# Patient Record
Sex: Female | Born: 1995 | Race: Black or African American | Hispanic: No | Marital: Single | Smoking: Never smoker
Health system: Southern US, Community
[De-identification: ages and names within clinical notes are randomized; demographics above are authoritative.]

---

## 2016-11-24 ENCOUNTER — Encounter (HOSPITAL_COMMUNITY): Payer: Self-pay | Admitting: Emergency Medicine

## 2016-11-24 ENCOUNTER — Inpatient Hospital Stay (HOSPITAL_COMMUNITY): Payer: Federal, State, Local not specified - PPO

## 2016-11-24 ENCOUNTER — Inpatient Hospital Stay (HOSPITAL_COMMUNITY)
Admission: EM | Admit: 2016-11-24 | Discharge: 2016-11-26 | DRG: 093 | Disposition: A | Discharge status: Home / Self Care | Payer: Federal, State, Local not specified - PPO | Attending: Internal Medicine | Admitting: Internal Medicine

## 2016-11-24 DIAGNOSIS — E876 Hypokalemia: Secondary | ICD-10-CM | POA: Diagnosis present

## 2016-11-24 DIAGNOSIS — R45850 Homicidal ideations: Secondary | ICD-10-CM | POA: Diagnosis present

## 2016-11-24 DIAGNOSIS — G92 Toxic encephalopathy: Principal | ICD-10-CM | POA: Diagnosis present

## 2016-11-24 DIAGNOSIS — R443 Hallucinations, unspecified: Secondary | ICD-10-CM

## 2016-11-24 DIAGNOSIS — D649 Anemia, unspecified: Secondary | ICD-10-CM | POA: Diagnosis present

## 2016-11-24 DIAGNOSIS — F23 Brief psychotic disorder: Secondary | ICD-10-CM | POA: Diagnosis not present

## 2016-11-24 DIAGNOSIS — Z79899 Other long term (current) drug therapy: Secondary | ICD-10-CM

## 2016-11-24 DIAGNOSIS — R4182 Altered mental status, unspecified: Secondary | ICD-10-CM

## 2016-11-24 DIAGNOSIS — G9340 Encephalopathy, unspecified: Secondary | ICD-10-CM

## 2016-11-24 DIAGNOSIS — F29 Unspecified psychosis not due to a substance or known physiological condition: Secondary | ICD-10-CM

## 2016-11-24 DIAGNOSIS — R000 Tachycardia, unspecified: Secondary | ICD-10-CM

## 2016-11-24 DIAGNOSIS — D509 Iron deficiency anemia, unspecified: Secondary | ICD-10-CM

## 2016-11-24 DIAGNOSIS — E860 Dehydration: Secondary | ICD-10-CM | POA: Diagnosis present

## 2016-11-24 LAB — CBC
HCT: 23.9 % — ABNORMAL LOW (ref 36.0–46.0)
HCT: 25.7 % — ABNORMAL LOW (ref 36.0–46.0)
HEMOGLOBIN: 7 g/dL — AB (ref 12.0–15.0)
Hemoglobin: 6.7 g/dL — CL (ref 12.0–15.0)
MCH: 16.2 pg — AB (ref 26.0–34.0)
MCH: 15.9 pg — ABNORMAL LOW (ref 26.0–34.0)
MCHC: 28 g/dL — AB (ref 30.0–36.0)
MCHC: 27.2 g/dL — AB (ref 30.0–36.0)
MCV: 57.9 fL — AB (ref 78.0–100.0)
MCV: 58.5 fL — ABNORMAL LOW (ref 78.0–100.0)
PLATELETS: 486 10*3/uL — AB (ref 150–400)
Platelets: 426 10*3/uL — ABNORMAL HIGH (ref 150–400)
RBC: 4.13 MIL/uL (ref 3.87–5.11)
RBC: 4.39 MIL/uL (ref 3.87–5.11)
RDW: 20 % — AB (ref 11.5–15.5)
RDW: 19.9 % — ABNORMAL HIGH (ref 11.5–15.5)
WBC: 12.2 10*3/uL — AB (ref 4.0–10.5)
WBC: 10.8 10*3/uL — ABNORMAL HIGH (ref 4.0–10.5)

## 2016-11-24 LAB — ABO/RH: ABO/RH(D): AB POS

## 2016-11-24 LAB — ETHANOL

## 2016-11-24 LAB — COMPREHENSIVE METABOLIC PANEL
ALT: 11 U/L — AB (ref 14–54)
AST: 28 U/L (ref 15–41)
Albumin: 4.2 g/dL (ref 3.5–5.0)
Alkaline Phosphatase: 38 U/L (ref 38–126)
Anion gap: 14 (ref 5–15)
BILIRUBIN TOTAL: 0.9 mg/dL (ref 0.3–1.2)
BUN: 12 mg/dL (ref 6–20)
CHLORIDE: 106 mmol/L (ref 101–111)
CO2: 17 mmol/L — ABNORMAL LOW (ref 22–32)
CREATININE: 0.99 mg/dL (ref 0.44–1.00)
Calcium: 8.6 mg/dL — ABNORMAL LOW (ref 8.9–10.3)
GFR calc Af Amer: >60 mL/min (ref >60)
Glucose, Bld: 104 mg/dL — ABNORMAL HIGH (ref 65–99)
Potassium: 3.6 mmol/L (ref 3.5–5.1)
Sodium: 137 mmol/L (ref 135–145)
Total Protein: 7.8 g/dL (ref 6.5–8.1)

## 2016-11-24 LAB — IRON AND TIBC
Iron: 12 ug/dL — ABNORMAL LOW (ref 28–170)
Saturation Ratios: 2 % — ABNORMAL LOW (ref 10.4–31.8)
TIBC: 563 ug/dL — ABNORMAL HIGH (ref 250–450)
UIBC: 551 ug/dL

## 2016-11-24 LAB — FERRITIN: Ferritin: 2 ng/mL — ABNORMAL LOW (ref 11–307)

## 2016-11-24 LAB — RETICULOCYTES
RBC.: 4.18 MIL/uL (ref 3.87–5.11)
RETIC COUNT ABSOLUTE: 71.1 10*3/uL (ref 19.0–186.0)
Retic Ct Pct: 1.7 % (ref 0.4–3.1)

## 2016-11-24 LAB — TRANSFERRIN: TRANSFERRIN: 395 mg/dL — AB (ref 192–382)

## 2016-11-24 LAB — CREATININE, SERUM
CREATININE: 0.88 mg/dL (ref 0.44–1.00)
GFR calc Af Amer: >60 mL/min (ref >60)

## 2016-11-24 LAB — POC URINE PREG, ED: PREG TEST UR: NEGATIVE

## 2016-11-24 LAB — FOLATE: FOLATE: 18.6 ng/mL (ref >5.9)

## 2016-11-24 LAB — PROTIME-INR
INR: 1.36
Prothrombin Time: 16.9 seconds — ABNORMAL HIGH (ref 11.4–15.2)

## 2016-11-24 LAB — SALICYLATE LEVEL: Salicylate Lvl: <7 mg/dL (ref 2.8–30.0)

## 2016-11-24 LAB — RAPID URINE DRUG SCREEN, HOSP PERFORMED
AMPHETAMINES: NOT DETECTED
BARBITURATES: NOT DETECTED
Benzodiazepines: NOT DETECTED
COCAINE: NOT DETECTED
Opiates: NOT DETECTED
TETRAHYDROCANNABINOL: NOT DETECTED

## 2016-11-24 LAB — PREPARE RBC (CROSSMATCH)

## 2016-11-24 LAB — I-STAT BETA HCG BLOOD, ED (MC, WL, AP ONLY): I-stat hCG, quantitative: <5 m[IU]/mL (ref <5)

## 2016-11-24 LAB — VITAMIN B12: VITAMIN B 12: 261 pg/mL (ref 180–914)

## 2016-11-24 LAB — ACETAMINOPHEN LEVEL: Acetaminophen (Tylenol), Serum: <10 ug/mL — ABNORMAL LOW (ref 10–30)

## 2016-11-24 MED ORDER — SODIUM CHLORIDE 0.9 % IV BOLUS (SEPSIS)
1000.0000 mL | Freq: Once | INTRAVENOUS | Status: AC
  Administered 2016-11-24: 1000 mL via INTRAVENOUS

## 2016-11-24 MED ORDER — ONDANSETRON HCL 4 MG/2ML IJ SOLN: 4.0000 mg | Freq: Four times a day (QID) | INTRAMUSCULAR | Status: DC | PRN

## 2016-11-24 MED ORDER — LORAZEPAM 2 MG/ML IJ SOLN
INTRAMUSCULAR | Status: AC
  Filled 2016-11-24: qty 1

## 2016-11-24 MED ORDER — DEXTROSE-NACL 5-0.9 % IV SOLN
INTRAVENOUS | Status: DC
  Administered 2016-11-24 – 2016-11-25 (×2): via INTRAVENOUS

## 2016-11-24 MED ORDER — HALOPERIDOL 1 MG PO TABS
1.0000 mg | ORAL_TABLET | ORAL | Status: DC | PRN
  Administered 2016-11-24: 1 mg via ORAL
  Filled 2016-11-24 (×2): qty 1

## 2016-11-24 MED ORDER — HALOPERIDOL 5 MG PO TABS
5.0000 mg | ORAL_TABLET | ORAL | Status: DC | PRN
  Administered 2016-11-25: 5 mg via ORAL
  Filled 2016-11-24 (×3): qty 1

## 2016-11-24 MED ORDER — LORAZEPAM 2 MG/ML IJ SOLN: 1.0000 mg | INTRAMUSCULAR | Status: DC | PRN

## 2016-11-24 MED ORDER — ONDANSETRON HCL 4 MG PO TABS: 4.0000 mg | ORAL_TABLET | Freq: Four times a day (QID) | ORAL | Status: DC | PRN

## 2016-11-24 MED ORDER — HALOPERIDOL LACTATE 5 MG/ML IJ SOLN: 1.0000 mg | INTRAMUSCULAR | Status: DC | PRN

## 2016-11-24 MED ORDER — ACETAMINOPHEN 325 MG PO TABS: 650.0000 mg | ORAL_TABLET | Freq: Four times a day (QID) | ORAL | Status: DC | PRN

## 2016-11-24 MED ORDER — LORAZEPAM 2 MG/ML IJ SOLN
2.0000 mg | Freq: Once | INTRAMUSCULAR | Status: AC
  Administered 2016-11-24: 2 mg via INTRAMUSCULAR

## 2016-11-24 MED ORDER — SODIUM CHLORIDE 0.9 % IV SOLN: Freq: Once | INTRAVENOUS | Status: DC

## 2016-11-24 MED ORDER — ENOXAPARIN SODIUM 40 MG/0.4ML ~~LOC~~ SOLN: 40.0000 mg | SUBCUTANEOUS | Status: DC

## 2016-11-24 MED ORDER — ACETAMINOPHEN 650 MG RE SUPP: 650.0000 mg | Freq: Four times a day (QID) | RECTAL | Status: DC | PRN

## 2016-11-24 MED ORDER — HALOPERIDOL LACTATE 5 MG/ML IJ SOLN
5.0000 mg | Freq: Four times a day (QID) | INTRAMUSCULAR | Status: DC | PRN
  Administered 2016-11-24: 5 mg via INTRAMUSCULAR
  Filled 2016-11-24: qty 1

## 2016-11-24 NOTE — Consult Note (Signed)
[facility] Consult   Reason for Consult:  psychosis Referring Physician:  Dr. Cathlean Sauer Patient Identification: Catherine Townsend MRN:  [id number] Principal Diagnosis: Acute psychosis Diagnosis:   Patient Active Problem List   Diagnosis Date Noted  . Acute psychosis [F23] 11/24/2016    Priority: High  . Anemia [D64.9] 11/24/2016    Total Time spent with patient: 45 minutes  Subjective:   Catherine Townsend is a 21 y.o. female patient admitted with bizarre behavior.  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 Patient denies any prior history of mental illness but admits to history of Cannabis abuse. Patient presents with bizarre behavior and she is not aware of her current environment, thinks she is in [place] and has to be told she is in [facility]. Patient seems confused, disoriented and unaware of how she ended up in hospital. She was reportedly brought by the police due to strange behavior, was found naked on the straight, disoriented and confused. Patient reports auditory hallucination-hearing voices telling the future does not exist. When asked questions about her, she keeps saying: "666, IDK, YES, SEX, SIN''. Patient endorses being stressed out with her college work but  denies visual hallucinations, suicidal thoughts, alcohol abuse but smokes Cannabis regularly.  Past Psychiatric History: denies  Risk to Self:   Risk to Others:   Prior Inpatient Therapy:   Prior Outpatient Therapy:    Past Medical History: History reviewed. No pertinent past medical history. History reviewed. No pertinent surgical history. Family History: History reviewed. No pertinent family history. Family Psychiatric  History: Social History:  History  Alcohol Use No     History  Drug Use  . Types: Marijuana    Social History   Social History  . Marital status: Single    Spouse name: N/A  . Number of  children: N/A  . Years of education: N/A   Social History Main Topics  . Smoking status: Unknown If Ever Smoked  . Smokeless tobacco: None  . Alcohol use No  . Drug use: Yes    Types: Marijuana  . Sexual activity: Not Asked   Other Topics Concern  . None   Social History Narrative  . None   Additional Social History:    Allergies:  No Known Allergies  Labs:  Results for orders placed or performed during the hospital encounter of 11/24/16 (from the past 48 hour(s))  Comprehensive metabolic panel     Status: Abnormal   Collection Time: 11/24/16  7:15 AM  Result Value Ref Range   Sodium 137 135 - 145 mmol/L   Potassium 3.6 3.5 - 5.1 mmol/L   Chloride 106 101 - 111 mmol/L   CO2 17 (L) 22 - 32 mmol/L   Glucose, Bld 104 (H) 65 - 99 mg/dL   BUN 12 6 - 20 mg/dL   Creatinine, Ser 0.99 0.44 - 1.00 mg/dL   Calcium 8.6 (L) 8.9 - 10.3 mg/dL   Total Protein 7.8 6.5 - 8.1 g/dL   Albumin 4.2 3.5 - 5.0 g/dL   AST 28 15 - 41 U/L   ALT 11 (L) 14 - 54 U/L   Alkaline Phosphatase 38 38 - 126 U/L   Total Bilirubin 0.9 0.3 - 1.2 mg/dL   GFR calc non Af Amer >60 >60 mL/min   GFR calc Af Amer >60 >60 mL/min    Comment: (NOTE) The eGFR has been calculated  using the CKD EPI equation. This calculation has not been validated in all clinical situations. eGFR's persistently <60 mL/min signify possible Chronic Kidney Disease.    Anion gap 14 5 - 15  Ethanol     Status: None   Collection Time: 11/24/16  7:15 AM  Result Value Ref Range   Alcohol, Ethyl (B) <5 <5 mg/dL    Comment:        LOWEST DETECTABLE LIMIT FOR SERUM ALCOHOL IS 5 mg/dL FOR MEDICAL PURPOSES ONLY   Salicylate level     Status: None   Collection Time: 11/24/16  7:15 AM  Result Value Ref Range   Salicylate Lvl <8.1 2.8 - 30.0 mg/dL  Acetaminophen level     Status: Abnormal   Collection Time: 11/24/16  7:15 AM  Result Value Ref Range   Acetaminophen (Tylenol), Serum <10 (L) 10 - 30 ug/mL    Comment:        THERAPEUTIC  CONCENTRATIONS VARY SIGNIFICANTLY. A RANGE OF 10-30 ug/mL MAY BE AN EFFECTIVE CONCENTRATION FOR MANY PATIENTS. HOWEVER, SOME ARE BEST TREATED AT CONCENTRATIONS OUTSIDE THIS RANGE. ACETAMINOPHEN CONCENTRATIONS >150 ug/mL AT 4 HOURS AFTER INGESTION AND >50 ug/mL AT 12 HOURS AFTER INGESTION ARE OFTEN ASSOCIATED WITH TOXIC REACTIONS.   Protime-INR     Status: Abnormal   Collection Time: 11/24/16  7:15 AM  Result Value Ref Range   Prothrombin Time 16.9 (H) 11.4 - 15.2 seconds   INR 1.36   I-Stat beta hCG blood, ED     Status: None   Collection Time: 11/24/16  7:21 AM  Result Value Ref Range   I-stat hCG, quantitative <5.0 <5 mIU/mL   Comment 3            Comment:   GEST. AGE      CONC.  (mIU/mL)   <=1 WEEK        5 - 50     2 WEEKS       50 - 500     3 WEEKS       100 - 10,000     4 WEEKS     1,000 - 30,000        FEMALE AND NON-PREGNANT FEMALE:     LESS THAN 5 mIU/mL   CBC     Status: Abnormal   Collection Time: 11/24/16  8:00 AM  Result Value Ref Range   WBC 12.2 (H) 4.0 - 10.5 K/uL   RBC 4.13 3.87 - 5.11 MIL/uL   Hemoglobin 6.7 (LL) 12.0 - 15.0 g/dL    Comment: CRITICAL RESULT CALLED TO, READ BACK BY AND VERIFIED WITH: DOWD,P RN [phone] COVINGTON,N    HCT 23.9 (L) 36.0 - 46.0 %   MCV 57.9 (L) 78.0 - 100.0 fL   MCH 16.2 (L) 26.0 - 34.0 pg   MCHC 28.0 (L) 30.0 - 36.0 g/dL   RDW 20.0 (H) 11.5 - 15.5 %   Platelets 486 (H) 150 - 400 K/uL  Type and screen [place]     Status: None (Preliminary result)   Collection Time: 11/24/16  9:00 AM  Result Value Ref Range   ABO/RH(D) AB POS    Antibody Screen NEG    Sample Expiration 11/27/2016    Unit Number [id number]    Blood Component Type RED CELLS,LR    Unit division 00    Status of Unit ALLOCATED    Transfusion Status OK TO TRANSFUSE    Crossmatch Result Compatible   Prepare RBC  Status: None   Collection Time: 11/24/16  9:00 AM  Result Value Ref Range   Order Confirmation ORDER  PROCESSED BY BLOOD BANK   ABO/Rh     Status: None   Collection Time: 11/24/16  9:00 AM  Result Value Ref Range   ABO/RH(D) AB POS   Reticulocytes     Status: None   Collection Time: 11/24/16 11:05 AM  Result Value Ref Range   Retic Ct Pct 1.7 0.4 - 3.1 %   RBC. 4.18 3.87 - 5.11 MIL/uL   Retic Count, Manual 71.1 19.0 - 186.0 K/uL  Rapid urine drug screen (hospital performed)     Status: None   Collection Time: 11/24/16 11:10 AM  Result Value Ref Range   Opiates NONE DETECTED NONE DETECTED   Cocaine NONE DETECTED NONE DETECTED   Benzodiazepines NONE DETECTED NONE DETECTED   Amphetamines NONE DETECTED NONE DETECTED   Tetrahydrocannabinol NONE DETECTED NONE DETECTED   Barbiturates NONE DETECTED NONE DETECTED    Comment:        DRUG SCREEN FOR MEDICAL PURPOSES ONLY.  IF CONFIRMATION IS NEEDED FOR ANY PURPOSE, NOTIFY LAB WITHIN 5 DAYS.        LOWEST DETECTABLE LIMITS FOR URINE DRUG SCREEN Drug Class       Cutoff (ng/mL) Amphetamine      1000 Barbiturate      200 Benzodiazepine   191 Tricyclics       478 Opiates          300 Cocaine          300 THC              50   POC urine preg, ED (not at [facility])     Status: None   Collection Time: 11/24/16 11:26 AM  Result Value Ref Range   Preg Test, Ur NEGATIVE NEGATIVE    Comment:        THE SENSITIVITY OF THIS METHODOLOGY IS >24 mIU/mL     Current Facility-Administered Medications  Medication Dose Route Frequency Provider Last Rate Last Dose  . acetaminophen (TYLENOL) tablet 650 mg  650 mg Oral Q6H PRN Tawni Millers, MD       Or  . acetaminophen (TYLENOL) suppository 650 mg  650 mg Rectal Q6H PRN Tawni Millers, MD      . dextrose 5 %-0.9 % sodium chloride infusion   Intravenous Continuous Mauricio Gerome Apley, MD      . enoxaparin (LOVENOX) injection 40 mg  40 mg Subcutaneous Q24H Tawni Millers, MD      . ondansetron [facility]) tablet 4 mg  4 mg Oral Q6H PRN Tawni Millers, MD       Or  .  ondansetron [facility]) injection 4 mg  4 mg Intravenous Q6H PRN Mauricio Gerome Apley, MD        Musculoskeletal: Strength & Muscle Tone: within normal limits Gait & Station: normal Patient leans: N/A  Psychiatric Specialty Exam: Physical Exam  ROS  Blood pressure 132/68, pulse (!) 107, temperature 99.7 F (37.6 C), temperature source Oral, resp. rate 20, SpO2 100 %.There is no height or weight on file to calculate BMI.  General Appearance: Casual  Eye Contact:  Minimal  Speech:  Clear and Coherent  Volume:  Normal  Mood:  Anxious  Affect:  Constricted  Thought Process:  Disorganized  Orientation:  Other:  only to person  Thought Content:  Illogical and Hallucinations: Auditory  Suicidal Thoughts:  No  Homicidal Thoughts:  No  Memory:  Immediate;   Fair Recent;   Fair Remote;   Fair  Judgement:  Poor  Insight:  Lacking  Psychomotor Activity:  Psychomotor Retardation and Restlessness  Concentration:  Concentration: Poor and Attention Span: Poor  Recall:  [employer] of Knowledge:  Fair  Language:  Good  Akathisia:  No  Handed:  Right  AIMS (if indicated):     Assets:  Communication Skills  ADL's:  Intact  Cognition:  WNL  Sleep:   fair     Treatment Plan Summary: 21 year old college student who denies prior history of mental illness but presents with auditory hallucination and bizarre behavior. Patient urine toxicology is negative but her hemoglobin is low.  Plan/Recommendation: -Rule out medical causes of psychosis - Folate, Ferritin,  Vit B12 level  -Head CT scan  to rule brain lesion. -Will not recommend anti-psychotic medication until medical causes is ruled out.  Disposition: No evidence of imminent risk to self or others at present.   Supportive therapy provided about ongoing stressors. Re-consult psych services if necessary.  Corena Pilgrim, MD 11/24/2016 2:50 PM

## 2016-11-24 NOTE — ED Provider Notes (Signed)
[facility]-EMERGENCY DEPT Provider Note   CSN: [id number] Arrival date & time: 11/24/16  7829     History   Chief Complaint Chief Complaint  Patient presents with  . Drug Overdose    HPI Catherine Townsend is a 21 y.o. female with an unknown past medical history who presents by Patent examiner and EMS for altered mental status and violent behavior. According to nursing who spoke with long forceps, patient was found naked in the cold vandalizing a porch. Nursing reports that patient informed that she used crack cocaine and drank alcohol last night. On my initial assessment, patient has no complains. She says she feels "fantastic". She says that she has seen me in her dreams before and that she is currently hearing God speaking to her. She says that the Bible also speaks to her and tells her what to do. She denies suicidal ideation but reports that she had some homicidal thoughts earlier tonight. She will not elaborate on this. When asked to she was trying to hurt, she said "the love".  Patient denies any fevers, chills, chest pain, shortness breath, nausea, vomiting, constipation, diarrhea. Patient denies taking any medications but says she has "all the pills" at home.     HPI  History reviewed. No pertinent past medical history.  There are no active problems to display for this patient.   History reviewed. No pertinent surgical history.  OB History    No data available       Home Medications    Prior to Admission medications   Not on File    Family History No family history on file.  Social History Social History  Substance Use Topics  . Smoking status: Not on file  . Smokeless tobacco: Not on file  . Alcohol use Not on file     Allergies   Patient has no known allergies.   Review of Systems Review of Systems  Unable to perform ROS: Psychiatric disorder     Physical Exam Updated Vital Signs BP 135/86 (BP Location: Right Arm)   Pulse 120   Temp 98 F (36.7 C)  (Oral)   Resp 22   SpO2 100%   Physical Exam  Constitutional: She is oriented to person, place, and time. She appears well-developed and well-nourished. No distress.  HENT:  Head: Normocephalic and atraumatic.  Right Ear: External ear normal.  Left Ear: External ear normal.  Nose: Nose normal.  Mouth/Throat: Oropharynx is clear and moist. No oropharyngeal exudate.  Eyes: Conjunctivae and EOM are normal. Pupils are equal, round, and reactive to light.  Neck: Normal range of motion. Neck supple.  Cardiovascular: Normal heart sounds and intact distal pulses.  Tachycardia present.   No murmur heard. Pulmonary/Chest: Effort normal and breath sounds normal. No stridor. No respiratory distress. She exhibits no tenderness.  Abdominal: She exhibits no distension. There is no tenderness. There is no rebound.  Musculoskeletal: She exhibits no tenderness.  Neurological: She is alert and oriented to person, place, and time. She has normal reflexes. She exhibits normal muscle tone. Coordination normal.  Skin: Skin is warm. No rash noted. She is not diaphoretic. No erythema.  Psychiatric: Her mood appears anxious. Her speech is tangential. She is actively hallucinating. She is not agitated. Thought content is paranoid. She expresses homicidal ideation. She expresses no suicidal ideation. She expresses no suicidal plans and no homicidal plans. She is noncommunicative.  Nursing note and vitals reviewed.    ED Treatments / Results  Labs (all labs ordered are  listed, but only abnormal results are displayed) Labs Reviewed  COMPREHENSIVE METABOLIC PANEL - Abnormal; Notable for the following:       Result Value   CO2 17 (*)    Glucose, Bld 104 (*)    Calcium 8.6 (*)    ALT 11 (*)    All other components within normal limits  ACETAMINOPHEN LEVEL - Abnormal; Notable for the following:    Acetaminophen (Tylenol), Serum <10 (*)    All other components within normal limits  CBC - Abnormal; Notable for  the following:    WBC 12.2 (*)    Hemoglobin 6.7 (*)    HCT 23.9 (*)    MCV 57.9 (*)    MCH 16.2 (*)    MCHC 28.0 (*)    RDW 20.0 (*)    Platelets 486 (*)    All other components within normal limits  PROTIME-INR - Abnormal; Notable for the following:    Prothrombin Time 16.9 (*)    All other components within normal limits  IRON AND TIBC - Abnormal; Notable for the following:    Iron 12 (*)    TIBC 563 (*)    Saturation Ratios 2 (*)    All other components within normal limits  FERRITIN - Abnormal; Notable for the following:    Ferritin 2 (*)    All other components within normal limits  CBC - Abnormal; Notable for the following:    WBC 10.8 (*)    Hemoglobin 7.0 (*)    HCT 25.7 (*)    MCV 58.5 (*)    MCH 15.9 (*)    MCHC 27.2 (*)    RDW 19.9 (*)    Platelets 426 (*)    All other components within normal limits  TRANSFERRIN - Abnormal; Notable for the following:    Transferrin 395 (*)    All other components within normal limits  COMPREHENSIVE METABOLIC PANEL - Abnormal; Notable for the following:    Glucose, Bld 103 (*)    Calcium 8.8 (*)    ALT 11 (*)    Alkaline Phosphatase 35 (*)    All other components within normal limits  CBC - Abnormal; Notable for the following:    Hemoglobin 6.9 (*)    HCT 24.8 (*)    MCV 58.4 (*)    MCH 16.2 (*)    MCHC 27.8 (*)    RDW 20.1 (*)    Platelets 401 (*)    All other components within normal limits  T4, FREE - Abnormal; Notable for the following:    Free T4 1.55 (*)    All other components within normal limits  CBC - Abnormal; Notable for the following:    Hemoglobin 9.2 (*)    HCT 31.1 (*)    MCV 63.0 (*)    MCH 18.6 (*)    MCHC 29.6 (*)    RDW 23.4 (*)    All other components within normal limits  URINE CULTURE  ETHANOL  SALICYLATE LEVEL  RAPID URINE DRUG SCREEN, HOSP PERFORMED  VITAMIN B12  FOLATE  RETICULOCYTES  CREATININE, SERUM  RPR  TSH  URINALYSIS, ROUTINE W REFLEX MICROSCOPIC  GLUCOSE, CAPILLARY    HIV ANTIBODY (ROUTINE TESTING)  HEPATITIS B SURFACE ANTIGEN  HEPATITIS C ANTIBODY  CBG MONITORING, ED  I-STAT BETA HCG BLOOD, ED ([facility], [facility], [facility] ONLY)  POC OCCULT BLOOD, ED  POC URINE PREG, ED  TYPE AND SCREEN  PREPARE RBC (CROSSMATCH)  ABO/RH  TYPE AND SCREEN  TYPE AND SCREEN    EKG  EKG Interpretation  Date/Time:  Saturday November 24 2016 07:09:29 EST Ventricular Rate:  110 PR Interval:    QRS Duration: 71 QT Interval:  327 QTC Calculation: 443 R Axis:   68 Text Interpretation:  Sinus tachycardia Borderline T wave abnormalities No STEMI Confirmed by Rush Landmark MD, Kooper Godshall [phone]) on 11/24/2016 4:37:17 PM       Radiology Dg Chest Portable 1 View  Result Date: 11/24/2016 CLINICAL DATA:  Altered mental status, tachycardia EXAM: PORTABLE CHEST 1 VIEW COMPARISON:  None. FINDINGS: The heart size and mediastinal contours are within normal limits. Both lungs are clear. The visualized skeletal structures are unremarkable. IMPRESSION: No active disease. Electronically Signed   By: Natasha Mead M.D.   On: 11/24/2016 12:21    Procedures Procedures (including critical care time)  Medications Ordered in ED Medications  0.9 %  sodium chloride infusion (not administered)  sodium chloride 0.9 % bolus 1,000 mL (0 mLs Intravenous Stopped 11/24/16 1030)     Initial Impression / Assessment and Plan / ED Course  I have reviewed the triage vital signs and the nursing notes.  Pertinent labs & imaging results that were available during my care of the patient were reviewed by me and considered in my medical decision making (see chart for details).     Catherine Townsend is a 21 y.o. female with an unknown past medical history who presents by Patent examiner and EMS for altered mental status and violent behavior.    history and exam are seen above. On exam, patient is hallucinating and appear psychotic. Patient reports recent homicidal ideation. Patient had clear lungs and nontender abdomen. Patient  had sensation in all extremities. Exam otherwise unremarkable.  Patient had screening laboratory testing to look for intoxication or other causes of altered mental status. Patient denies any traumatic injuries and has no evidence of injury on her head. Patient we given fluids for tachycardia. Patient will need TTS consult for further management in the setting of her violence, hallucinations, and recent homicidal thoughts overnight.  8:52 AM Nursing called report that patient's initial hemoglobin returned at 6.7. Given patient's altered mental status, patient will be typed and screened and 1 unit of blood will be ordered.  10:05 AM Patient refused rectal exam for fecal occult blood testing.   Given patient's persistent tachycardia and anemia, patient will have blood transfusion initiated. Patient will be admitted for further management of her anemia of unknown etiology and altered mental status. Suspect patient will require TTS consultation after her anemia is improved and she is medically cleared. Hospitalist team was called and agreed with admission. Orders were placed for patient to be admitted.  12:52 PM Nursing reports that patient began wandering around the room and appeared as though she might try and leave the emergency department. Given her psychosis, report of command hallucinations, reported homicidal ideation overnight with destructive actions, and her altered mental status, patient was placed under involuntary commitment by me as patient is a danger to others at this time. She is awaiting admission.   Final Clinical Impressions(s) / ED Diagnoses   Final diagnoses:  Altered mental status, unspecified altered mental status type  Hallucinations  Anemia, unspecified type  Tachycardia    Clinical Impression: 1. Altered mental status, unspecified altered mental status type   2. Hallucinations   3. Anemia, unspecified type   4. Tachycardia     Disposition: Admit to Hospitalist  service    Heide Scales, MD  11/26/16 0947  

## 2016-11-24 NOTE — Progress Notes (Signed)
Pt pulled blood bank bracelet off prior to arrival. MD called for update, re-entered order for type and screen.

## 2016-11-24 NOTE — ED Notes (Signed)
She has occasional random, strange outbursts; alternating with calmness and cooperation. As I write this she is being profane, calling people names, however, she does remains cooperative.

## 2016-11-24 NOTE — ED Notes (Signed)
I have just given report to [place], RN on [address] and will transport shortly.

## 2016-11-24 NOTE — ED Notes (Signed)
She follows all commands with ease; and was able to sign her blood consent. I was with Dr. Rush Landmarkegeler when he discussed the need for/risks of transfusion with which treatment plan she agrees.

## 2016-11-24 NOTE — ED Notes (Signed)
Lab phones and informs us that pt. "has an antibody and it will be some time yet before T & Crossmatch is concluded.

## 2016-11-24 NOTE — ED Notes (Signed)
She is resting comfortably and has no requests at this time. Lavender tube re-drawn at this time per lab.

## 2016-11-24 NOTE — ED Triage Notes (Signed)
Pt brought via EMS, found naked on someone's front porch of their house.  Pt had vandalized the porch.  Admits to crack and alcohol use this evening.  Pt is alert but not oriented, answers questions poorly and with rambling sentences.  Sinus tach in EMS.  18 G LAC.  CBG 199.  Given 250 ml NS.

## 2016-11-24 NOTE — H&P (Signed)
History and Physical    Catherine PickettKori Kiel [id number] DOB: February 24, 1996 DOA: 11/24/2016  PCP: Pcp Not In System   Patient coming from: Home  Chief Complaint: Altered mental status.   HPI: Catherine Townsend is a 21 y.o. female with no significant medical history, brought to the emergency room by the police due to strange behavior. She was found naked on the straight, disoriented and confused. She was brought into the ED for further evaluation. All information is obtained from the ED staff, patient is mute, only answering yes or no with her head without verbalizing.   Denies any easy fatigability, somnolence or decreased energy. Denies any hematuria, hematemesis, hematochezia or melena.   ED Course: Patient was found to be anemic, not very communicative, refuses rectal exam.   Review of Systems: Unable to obtain due to patient's poor cooperation.  History reviewed. No pertinent past medical history.  History reviewed. No pertinent surgical history.   has no tobacco, alcohol, and drug history on file.  No Known Allergies  No family history on file. Unacceptable: Noncontributory, unremarkable, or negative. Acceptable: Family history reviewed and not pertinent (If you reviewed it)  Prior to Admission medications   Not on File    Physical Exam: Vitals:   11/24/16 1045 11/24/16 1100 11/24/16 1115 11/24/16 1141  BP:  141/79  142/80  Pulse:    112  Resp: 12 21 (!) 31 21  Temp:    99.3 F (37.4 C)  TempSrc:    Oral  SpO2:    100%      Constitutional: NAD, calm, comfortable Vitals:   11/24/16 1045 11/24/16 1100 11/24/16 1115 11/24/16 1141  BP:  141/79  142/80  Pulse:    112  Resp: 12 21 (!) 31 21  Temp:    99.3 F (37.4 C)  TempSrc:    Oral  SpO2:    100%   Eyes: PERRL, lids and conjunctivae Mildly pale, no icterus Head normocephalic. Nose and ears no deformities.  ENMT: Mucous membranes are moist. Posterior pharynx clear of any exudate or lesions.Normal dentition.  Neck:  normal, supple, no masses, no thyromegaly Respiratory: clear to auscultation bilaterally, no wheezing, no crackles. Normal respiratory effort. No accessory muscle use.  Cardiovascular: Regular rate and rhythm, no murmurs / rubs / gallops. No extremity edema. 2+ pedal pulses. No carotid bruits.  Abdomen: no tenderness, no masses palpated. No hepatosplenomegaly. Bowel sounds positive.  Musculoskeletal: no clubbing / cyanosis. No joint deformity upper and lower extremities. Good ROM, no contractures. Normal muscle tone.  Skin: no rashes, lesions, ulcers. No induration Neurologic: CN 2-12 grossly intact. Sensation intact, DTR normal. Strength 5/5 in all 4. Patient is mute but follows commands and seems to understand verbal language.     Labs on Admission: I have personally reviewed following labs and imaging studies  CBC:  Recent Labs Lab 11/24/16 0800  WBC 12.2*  HGB 6.7*  HCT 23.9*  MCV 57.9*  PLT 486*   Basic Metabolic Panel:  Recent Labs Lab 11/24/16 0715  NA 137  K 3.6  CL 106  CO2 17*  GLUCOSE 104*  BUN 12  CREATININE 0.99  CALCIUM 8.6*   GFR: CrCl cannot be calculated (Unknown ideal weight.). Liver Function Tests:  Recent Labs Lab 11/24/16 0715  AST 28  ALT 11*  ALKPHOS 38  BILITOT 0.9  PROT 7.8  ALBUMIN 4.2   No results for input(s): LIPASE, AMYLASE in the last 168 hours. No results for input(s): AMMONIA in the last 168  hours. Coagulation Profile: No results for input(s): INR, PROTIME in the last 168 hours. Cardiac Enzymes: No results for input(s): CKTOTAL, CKMB, CKMBINDEX, TROPONINI in the last 168 hours. BNP (last 3 results) No results for input(s): PROBNP in the last 8760 hours. HbA1C: No results for input(s): HGBA1C in the last 72 hours. CBG: No results for input(s): GLUCAP in the last 168 hours. Lipid Profile: No results for input(s): CHOL, HDL, LDLCALC, TRIG, CHOLHDL, LDLDIRECT in the last 72 hours. Thyroid Function Tests: No results for  input(s): TSH, T4TOTAL, FREET4, T3FREE, THYROIDAB in the last 72 hours. Anemia Panel:  Recent Labs  11/24/16 1105  RETICCTPCT 1.7   Urine analysis: No results found for: COLORURINE, APPEARANCEUR, LABSPEC, PHURINE, GLUCOSEU, HGBUR, BILIRUBINUR, KETONESUR, PROTEINUR, UROBILINOGEN, NITRITE, LEUKOCYTESUR Sepsis Labs: !!!!!!!!!!!!!!!!!!!!!!!!!!!!!!!!!!!!!!!!!!!! @LABRCNTIP (procalcitonin:4,lacticidven:4) )No results found for this or any previous visit (from the past 240 hour(s)).   Radiological Exams on Admission: No results found.  EKG: Independently reviewed. Normal sinus rhythm with a rate of 100 bmp.   Assessment/Plan Active Problems:   Anemia  This is a 21 year old female presents to the hospital brought by police, she does have psychotic features, she is not cooperative with the history and physical. Temperature 99.3, blood pressure 142/80, heart rate 120s, respiratory rate 20, oxygen saturation 100%. There is no significant paleness or icterus, her lungs were clear to auscultation, heart S1-2 present tachycardic, abdomen is soft, lower extremities no edema, no ecchymosis. Sodium 137, potassium 3.6, chloride 6, bicarbonate 17, glucose 14, BUN 12, creatinine 0.99, white count 12.2, hb 6.7, hematocrit 23.9, platelets 486, pregnancy test negative. Acetaminophen less than 10, salicylate less than 7. Alcohol level less than 5.  The patient will be admitted to the medical floor with working diagnosis of anemia to rule out acute blood loss complicated by psychosis.  1. Anemia. Low MCV, suggesting iron deficiency, will check iron panel from pretransfusion blood sample. Will transfuse 2 units of PRBCs, follow-up on hemoglobin and hematocrit. Will send stool for Hemoccult.   2. Psychosis. Very likely underlying psychiatric condition, I doubt that the anemia is exacerbating this condition, will continue neuro checks per unit protocol, psychiatry has been consulted.  3. Hypokalemia. Will replete  with potassium chloride, follow electrolytes and kidney function in the morning.     DVT prophylaxis: scd Code Status: full  Family Communication: No family at the bedside  Disposition Plan: home  Consults called: Psychiatry  Admission status: Inpatient  Jaala Bohle Annett Gula MD [facility] Pager [phone]  If 7PM-7AM, please contact night-coverage [email or web address] Password [email or web address]  11/24/2016, 11:50 AM

## 2016-11-24 NOTE — ED Notes (Signed)
Her behavior has become more erratic; and she threatens to leave. Dr. Rush Landmarkegeler notified, and he is persuing IVC paperwork. Security and police are here. She again ambulates to b.r. And back without difficulty.

## 2016-11-24 NOTE — ED Notes (Signed)
We are awaiting the arrival of a sitter to go to [address] [place]. Pt. Remains in no distress.

## 2016-11-24 NOTE — ED Notes (Signed)
She has calmed down; and her sitter is here. I take her to [address] at this time.

## 2016-11-24 NOTE — ED Notes (Signed)
She has been acting strangely; gesticulating as one who is performing sign language. One of our staffers is familiar with signing; and states pt. "signing" is simply gibberish. She ambulates with accompaniment to b.r. At this time.

## 2016-11-24 NOTE — Progress Notes (Signed)
Pt has pulled off blood bank bracelet and we are unable to locate it in the room or in the hallway. Call to blood bank to see if pt would need to be type and crossed again and learned that she would. I have re-entered order for type and screen and original order still stands for transfusion of 2 units of PRBCs.

## 2016-11-25 ENCOUNTER — Encounter (HOSPITAL_COMMUNITY): Payer: Self-pay

## 2016-11-25 DIAGNOSIS — R45850 Homicidal ideations: Secondary | ICD-10-CM | POA: Diagnosis present

## 2016-11-25 DIAGNOSIS — F23 Brief psychotic disorder: Secondary | ICD-10-CM | POA: Diagnosis not present

## 2016-11-25 DIAGNOSIS — R443 Hallucinations, unspecified: Secondary | ICD-10-CM

## 2016-11-25 DIAGNOSIS — F29 Unspecified psychosis not due to a substance or known physiological condition: Secondary | ICD-10-CM | POA: Diagnosis not present

## 2016-11-25 DIAGNOSIS — G92 Toxic encephalopathy: Secondary | ICD-10-CM | POA: Diagnosis present

## 2016-11-25 DIAGNOSIS — D508 Other iron deficiency anemias: Secondary | ICD-10-CM

## 2016-11-25 DIAGNOSIS — D509 Iron deficiency anemia, unspecified: Secondary | ICD-10-CM

## 2016-11-25 DIAGNOSIS — E860 Dehydration: Secondary | ICD-10-CM | POA: Diagnosis present

## 2016-11-25 DIAGNOSIS — G9340 Encephalopathy, unspecified: Secondary | ICD-10-CM | POA: Diagnosis not present

## 2016-11-25 DIAGNOSIS — D649 Anemia, unspecified: Secondary | ICD-10-CM | POA: Diagnosis not present

## 2016-11-25 DIAGNOSIS — E876 Hypokalemia: Secondary | ICD-10-CM | POA: Diagnosis present

## 2016-11-25 LAB — CBC
HCT: 24.8 % — ABNORMAL LOW (ref 36.0–46.0)
Hemoglobin: 6.9 g/dL — CL (ref 12.0–15.0)
MCH: 16.2 pg — ABNORMAL LOW (ref 26.0–34.0)
MCHC: 27.8 g/dL — ABNORMAL LOW (ref 30.0–36.0)
MCV: 58.4 fL — ABNORMAL LOW (ref 78.0–100.0)
PLATELETS: 401 10*3/uL — AB (ref 150–400)
RBC: 4.25 MIL/uL (ref 3.87–5.11)
RDW: 20.1 % — ABNORMAL HIGH (ref 11.5–15.5)
WBC: 8.9 10*3/uL (ref 4.0–10.5)

## 2016-11-25 LAB — COMPREHENSIVE METABOLIC PANEL
ALK PHOS: 35 U/L — AB (ref 38–126)
ALT: 11 U/L — AB (ref 14–54)
ANION GAP: 7 (ref 5–15)
AST: 19 U/L (ref 15–41)
Albumin: 4.1 g/dL (ref 3.5–5.0)
BILIRUBIN TOTAL: 0.7 mg/dL (ref 0.3–1.2)
BUN: 8 mg/dL (ref 6–20)
CALCIUM: 8.8 mg/dL — AB (ref 8.9–10.3)
CO2: 23 mmol/L (ref 22–32)
CREATININE: 0.74 mg/dL (ref 0.44–1.00)
Chloride: 107 mmol/L (ref 101–111)
Glucose, Bld: 103 mg/dL — ABNORMAL HIGH (ref 65–99)
Potassium: 3.6 mmol/L (ref 3.5–5.1)
Sodium: 137 mmol/L (ref 135–145)
TOTAL PROTEIN: 7.6 g/dL (ref 6.5–8.1)

## 2016-11-25 LAB — URINALYSIS, ROUTINE W REFLEX MICROSCOPIC
BILIRUBIN URINE: NEGATIVE
Glucose, UA: NEGATIVE mg/dL
Hgb urine dipstick: NEGATIVE
KETONES UR: NEGATIVE mg/dL
Leukocytes, UA: NEGATIVE
NITRITE: NEGATIVE
Protein, ur: NEGATIVE mg/dL
SPECIFIC GRAVITY, URINE: 1.009 (ref 1.005–1.030)
pH: 8 (ref 5.0–8.0)

## 2016-11-25 LAB — TSH: TSH: 1.208 u[IU]/mL (ref 0.350–4.500)

## 2016-11-25 LAB — T4, FREE: Free T4: 1.55 ng/dL — ABNORMAL HIGH (ref 0.61–1.12)

## 2016-11-25 MED ORDER — FERUMOXYTOL INJECTION 510 MG/17 ML
510.0000 mg | Freq: Once | INTRAVENOUS | Status: AC
  Administered 2016-11-25: 510 mg via INTRAVENOUS
  Filled 2016-11-25: qty 17

## 2016-11-25 MED ORDER — FERROUS SULFATE 325 (65 FE) MG PO TABS
325.0000 mg | ORAL_TABLET | Freq: Two times a day (BID) | ORAL | Status: DC
  Administered 2016-11-26: 325 mg via ORAL
  Filled 2016-11-25: qty 1

## 2016-11-25 MED ORDER — FERROUS SULFATE 325 (65 FE) MG PO TABS: 325.0000 mg | ORAL_TABLET | Freq: Two times a day (BID) | ORAL | 3 refills | Status: DC
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
## 2016-11-25 NOTE — Progress Notes (Deleted)
Pt transferred to 1401. Agree with previous RN assessment. Sitter at bedside. Pt placed on telemetry and connected to fluids. Pt resting comfortable in bed, but able to arouse. Pt disoriented to place, time and situation. Orders reviewed.Will continue to monitor closely.

## 2016-11-25 NOTE — Progress Notes (Addendum)
Pt transferred to [address] from [address]. Agree with previous RN assessment. Sitter at bedside. Pt placed on telemetry and connected to fluids. Pt resting comfortable in bed, but able to arouse. Pt disoriented to place, time and situation. Previous RN brought pt belongings in bag, included a jacket, shoes, and an samsung tablet. Belongs locked away in safety cabinet. Orders reviewed. Will continue to monitor closely.
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
## 2016-11-26 ENCOUNTER — Encounter (HOSPITAL_COMMUNITY): Payer: Self-pay

## 2016-11-26 LAB — TYPE AND SCREEN
ABO/RH(D): AB POS
Antibody Screen: NEGATIVE
BLOOD PRODUCT EXPIRATION DATE: 201801292359
BLOOD PRODUCT EXPIRATION DATE: 201801292359
Blood Product Expiration Date: 201801292359
ISSUE DATE / TIME: 201801210220
ISSUE DATE / TIME: 201801210539
UNIT DIVISION: 0
UNIT TYPE AND RH: 6200
Unit Type and Rh: 6200
Unit Type and Rh: 6200
Unit division: 0

## 2016-11-26 LAB — CBC
HCT: 31.1 % — ABNORMAL LOW (ref 36.0–46.0)
Hemoglobin: 9.2 g/dL — ABNORMAL LOW (ref 12.0–15.0)
MCH: 18.6 pg — AB (ref 26.0–34.0)
MCHC: 29.6 g/dL — ABNORMAL LOW (ref 30.0–36.0)
MCV: 63 fL — AB (ref 78.0–100.0)
PLATELETS: 322 10*3/uL (ref 150–400)
RBC: 4.94 MIL/uL (ref 3.87–5.11)
RDW: 23.4 % — AB (ref 11.5–15.5)
WBC: 7.6 10*3/uL (ref 4.0–10.5)

## 2016-11-26 LAB — GLUCOSE, CAPILLARY: GLUCOSE-CAPILLARY: 99 mg/dL (ref 65–99)

## 2016-11-26 LAB — RPR: RPR: NONREACTIVE

## 2016-11-26 MED ORDER — FERROUS SULFATE 325 (65 FE) MG PO TABS: 325.0000 mg | ORAL_TABLET | Freq: Two times a day (BID) | ORAL | 3 refills | Status: AC

## 2016-11-26 NOTE — Care Management Note (Signed)
Case Management Note  Patient Details  Name: Catherine PickettKori Carlini MRN: [id number] Date of Birth: 1996-08-04  Subjective/Objective:  21 y/o f admitted w/Acute psychosis. From home.                  Action/Plan:d/c home.   Expected Discharge Date:  11/26/16               Expected Discharge Plan:  Home/Self Care  In-House Referral:     Discharge planning Services  CM Consult  Post Acute Care Choice:    Choice offered to:     DME Arranged:    DME Agency:     HH Arranged:    HH Agency:     Status of Service:  Completed, signed off  If discussed at [employer] Length of Stay Meetings, dates discussed:    Additional Comments:  Lanier ClamMahabir, Halston [place], RN 11/26/2016, 11:27 AM
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
## 2016-11-27 LAB — HEPATITIS B SURFACE ANTIGEN: HEP B S AG: NEGATIVE

## 2016-11-27 LAB — HEPATITIS C ANTIBODY: HCV Ab: 0.1 s/co ratio (ref 0.0–0.9)

## 2016-11-27 LAB — URINE CULTURE

## 2016-11-27 LAB — HIV ANTIBODY (ROUTINE TESTING W REFLEX): HIV Screen 4th Generation wRfx: NONREACTIVE

## 2017-02-24 ENCOUNTER — Emergency Department (HOSPITAL_COMMUNITY)
Admission: EM | Admit: 2017-02-24 | Discharge: 2017-02-25 | Disposition: A | Discharge status: Other Acute Inpatient Hospital | Payer: Federal, State, Local not specified - PPO | Attending: Emergency Medicine | Admitting: Emergency Medicine

## 2017-02-24 ENCOUNTER — Encounter (HOSPITAL_COMMUNITY): Payer: Self-pay | Admitting: *Deleted

## 2017-02-24 DIAGNOSIS — Z79899 Other long term (current) drug therapy: Secondary | ICD-10-CM | POA: Insufficient documentation

## 2017-02-24 DIAGNOSIS — F23 Brief psychotic disorder: Secondary | ICD-10-CM | POA: Diagnosis present

## 2017-02-24 DIAGNOSIS — F29 Unspecified psychosis not due to a substance or known physiological condition: Secondary | ICD-10-CM | POA: Insufficient documentation

## 2017-02-24 LAB — ETHANOL: Alcohol, Ethyl (B): <5 mg/dL (ref <5)

## 2017-02-24 LAB — CBC WITH DIFFERENTIAL/PLATELET
BASOS ABS: 0 10*3/uL (ref 0.0–0.1)
BASOS PCT: 0 %
EOS PCT: 0 %
Eosinophils Absolute: 0 10*3/uL (ref 0.0–0.7)
HEMATOCRIT: 41.7 % (ref 36.0–46.0)
Hemoglobin: 14 g/dL (ref 12.0–15.0)
LYMPHS PCT: 22 %
Lymphs Abs: 1.5 10*3/uL (ref 0.7–4.0)
MCH: 28.2 pg (ref 26.0–34.0)
MCHC: 33.6 g/dL (ref 30.0–36.0)
MCV: 84.1 fL (ref 78.0–100.0)
MONO ABS: 0.4 10*3/uL (ref 0.1–1.0)
Monocytes Relative: 6 %
NEUTROS ABS: 4.8 10*3/uL (ref 1.7–7.7)
Neutrophils Relative %: 72 %
PLATELETS: 409 10*3/uL — AB (ref 150–400)
RBC: 4.96 MIL/uL (ref 3.87–5.11)
RDW: 15 % (ref 11.5–15.5)
WBC: 6.8 10*3/uL (ref 4.0–10.5)

## 2017-02-24 LAB — BASIC METABOLIC PANEL
ANION GAP: 12 (ref 5–15)
BUN: 6 mg/dL (ref 6–20)
CALCIUM: 9.4 mg/dL (ref 8.9–10.3)
CO2: 21 mmol/L — ABNORMAL LOW (ref 22–32)
CREATININE: 1.01 mg/dL — AB (ref 0.44–1.00)
Chloride: 102 mmol/L (ref 101–111)
GFR calc non Af Amer: >60 mL/min (ref >60)
Glucose, Bld: 108 mg/dL — ABNORMAL HIGH (ref 65–99)
POTASSIUM: 3.5 mmol/L (ref 3.5–5.1)
Sodium: 135 mmol/L (ref 135–145)

## 2017-02-24 MED ORDER — STERILE WATER FOR INJECTION IJ SOLN
INTRAMUSCULAR | Status: AC
  Filled 2017-02-24: qty 10

## 2017-02-24 MED ORDER — HYDROXYZINE HCL 25 MG PO TABS
25.0000 mg | ORAL_TABLET | Freq: Four times a day (QID) | ORAL | Status: DC | PRN
  Administered 2017-02-24 – 2017-02-25 (×2): 25 mg via ORAL
  Filled 2017-02-24 (×2): qty 1

## 2017-02-24 MED ORDER — TRAZODONE HCL 50 MG PO TABS
50.0000 mg | ORAL_TABLET | Freq: Every day | ORAL | Status: AC
  Administered 2017-02-24: 50 mg via ORAL
  Filled 2017-02-24: qty 1

## 2017-02-24 MED ORDER — ZIPRASIDONE MESYLATE 20 MG IM SOLR
20.0000 mg | Freq: Once | INTRAMUSCULAR | Status: AC
  Administered 2017-02-24: 20 mg via INTRAMUSCULAR
  Filled 2017-02-24: qty 20

## 2017-02-24 NOTE — ED Notes (Signed)
Pt continues to periodically yell out.  IVC papers at [employer]

## 2017-02-24 NOTE — ED Notes (Signed)
The patient was called several times for triage. She was escorted to the Emergency front desk by a staff nurse from the 6th floor where the patient was handing out her patient labels to patient's in their rooms.

## 2017-02-24 NOTE — BH Assessment (Signed)
Tele Assessment Note   Catherine Townsend is an African-American 21 y.o. female who presented to [facility].  She is currently voluntary.  Pt is a poor historian and had difficult responding to questions asked.  Per history, Pt is a Consulting civil engineer at [employer].  In January 2018, Pt presented to [facility] after being found naked on a person's porch.  At that time, Pt appeared psychotic.    Pt reported that she came to the hospital because she died last night and that she dies every night.  She stated that she was God, that she is "the source of all life," and that she created everything.  Pt spoke very rapidly, rambled, and exhibited flight of ideas.  She appeared to be responding to internal stimuli.  When asked about hallucination, Pt stated that she could hear all and see all.  Pt denied being suicidal or homicidal.  She did not respond to questions about previous hospitalizations or outpatient treatment.  When asked about psychotropic medication, Pt stated that she takes "all medicines."  Pt became very tearful at one point and began screaming, "I'm sorry, please forgive me" repeatedly, and also stated, "you don't know how many tears I've cried for you."  During assessment, Pt had fair eye contact.  She was dressed in scrubs and appeared appropriately groomed.  Pt denied suicidality, homicidality, and depressive symptoms, although tearfulness was evident.  Pt endorsed non-specific auditory and visual hallucination, and she appeared to respond to internal stimuli.  Pt endorsed use of marijuana.  UDS and BAL were not available at time of assessment.  Pt's speech was rapid and loud.  Thought processes were tangential, and thought content suggested active psychosis -- delusions that she was God.  Pt's memory and concentration could not be adequately assessed.  Impulse control, judgment, and insight were deemed poor.  Pt ignored/did not respond to questions about family, specifics about where she lived, or medication she may  take.  Consulted with Kathie Rhodes Rankin NP who recommended inpatient treatment.  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 No pertinent past medical history.  History reviewed. No pertinent surgical history.  Family History: History reviewed. No pertinent family history.  Social History:  reports that she has never smoked. She has never used smokeless tobacco. She reports that she uses drugs, including Marijuana. She reports that she does not drink alcohol.  Additional Social History:  Alcohol / Drug Use Pain Medications: See PTA Prescriptions: See PTA Over the Counter: See PTA History of alcohol / drug use?: Yes (Pt could not provide specifics)  CIWA: CIWA-Ar BP: (!) 136/94 Pulse Rate: (!) 121 COWS:    PATIENT STRENGTHS: (choose at least two) Average or above average intelligence Physical Health  Allergies: No Known Allergies  Home Medications:  (Not in a hospital admission)  OB/GYN Status:  No LMP recorded.  General Assessment Data Location of Assessment: [facility] ED TTS Assessment: In system Is this a Tele or Face-to-Face Assessment?: Tele Assessment Is this an Initial Assessment or a Re-assessment for this encounter?: Initial Assessment Marital status: Single Is patient pregnant?: Unknown Pregnancy Status: Unknown Living Arrangements: Other (Comment) ([facility]) Can pt return to current living arrangement?: Yes Admission Status: Voluntary Is patient capable of signing voluntary admission?: Yes Referral Source: Self/Family/Friend Insurance type: BCBS     Crisis Care Plan Living Arrangements: Other (Comment) ([facility]) Name of Psychiatrist: Unknown Name of Therapist: Unknown  Education Status Is patient currently in school?: Yes Name of school: [employer]  [place]  Risk to self with the past 6 months Suicidal Ideation: No Has patient been a risk to self within the past 6 months prior to admission? : Other (comment)  (Unknown) Suicidal Intent: No Has patient had any suicidal intent within the past 6 months prior to admission? : Other (comment) (Unknown) Is patient at risk for suicide?: No Suicidal Plan?: No Has patient had any suicidal plan within the past 6 months prior to admission? : Other (comment) (Unknown) Access to Means: No What has been your use of drugs/alcohol within the last 12 months?: Marijuana (UDS/BAC not available at time of assessment) Previous Attempts/Gestures: No Intentional Self Injurious Behavior: None Family Suicide History: Unable to assess Recent stressful life event(s): Other (Comment) (Unknown -- Pt would not answer) Persecutory voices/beliefs?: No Depression: No Depression Symptoms: Tearfulness Substance abuse history and/or treatment for substance abuse?: Yes Suicide prevention information given to non-admitted patients: Not applicable  Risk to Others within the past 6 months Homicidal Ideation: No Does patient have any lifetime risk of violence toward others beyond the six months prior to admission? : No Thoughts of Harm to Others: No Current Homicidal Intent: No Current Homicidal Plan: No Access to Homicidal Means: No History of harm to others?: No Assessment of Violence: None Noted Does patient have access to weapons?: No Criminal Charges Pending?: No Does patient have a court date: No Is patient on probation?: No  Psychosis Hallucinations: Auditory, Visual Delusions: Grandiose  Mental Status Report Appearance/Hygiene: In scrubs, Unremarkable Eye Contact: Fair Motor Activity: Freedom of movement, Unremarkable Speech: Incoherent, Rapid, Tangential Level of Consciousness: Alert Mood: Preoccupied, Suspicious Affect: Labile Anxiety Level: None Thought Processes: Flight of Ideas Judgement: Impaired Orientation: Person, Place Obsessive Compulsive Thoughts/Behaviors: None  Cognitive Functioning Concentration: Fair Memory: Recent Impaired, Remote  Impaired IQ: Average Insight: Poor Impulse Control: Poor Appetite: Good Sleep: No Change Vegetative Symptoms: None  ADLScreening [facility] Assessment Services) Patient's cognitive ability adequate to safely complete daily activities?: Yes Patient able to express need for assistance with ADLs?: Yes Independently performs ADLs?: Yes (appropriate for developmental age)  Prior Inpatient Therapy Prior Inpatient Therapy: No (Unknown)  Prior Outpatient Therapy Prior Outpatient Therapy: No (Unknown) Does patient have an ACCT team?: Unknown Does patient have Intensive In-House Services?  : Unknown Does patient have [facility] services? : Unknown Does patient have P4CC services?: Unknown  ADL Screening (condition at time of admission) Patient's cognitive ability adequate to safely complete daily activities?: Yes Is the patient deaf or have difficulty hearing?: No Does the patient have difficulty seeing, even when wearing glasses/contacts?: No Does the patient have difficulty concentrating, remembering, or making decisions?: No Patient able to express need for assistance with ADLs?: Yes Does the patient have difficulty dressing or bathing?: No Independently performs ADLs?: Yes (appropriate for developmental age) Does the patient have difficulty walking or climbing stairs?: No Weakness of Legs: None Weakness of Arms/Hands: None  Home Assistive Devices/Equipment Home Assistive Devices/Equipment: None  Therapy Consults (therapy consults require a physician order) PT Evaluation Needed: No OT Evalulation Needed: No SLP Evaluation Needed: No Abuse/Neglect Assessment (Assessment to be complete while patient is alone) Physical Abuse: Denies (Pt could not adquately respond to these questions) Verbal Abuse: Denies Sexual Abuse: Denies Exploitation of patient/patient's resources: Denies Self-Neglect: Denies Values / Beliefs Cultural Requests During Hospitalization: None Spiritual Requests During  Hospitalization: None Consults Spiritual Care Consult Needed: No Social Work Consult Needed: No Merchant navy officer (For Healthcare) Does Patient Have a Medical Advance Directive?: No    Additional  Information 1:1 In Past 12 Months?: No CIRT Risk: No Elopement Risk: No Does patient have medical clearance?: Yes     Disposition:  Disposition Initial Assessment Completed for this Encounter: Yes Disposition of Patient: Inpatient treatment program Type of inpatient treatment program: Adult (Per S Rankin, NP, Pt meets inpt criteria)  Dorris Fetch Diksha Tagliaferro 02/24/2017 12:56 PM

## 2017-02-24 NOTE — ED Notes (Addendum)
Spoke to pt mother via phone to let her know Charle is safe.  No details given. Pt is student at [employer]

## 2017-02-24 NOTE — ED Notes (Addendum)
Pt asked me to flip through channels on tv and asked me to stop on a channel that has white noise and black and white fuzz on the tv screen stated she could hear through the white noise and can see the white flame talking to herself at this time looking at the tv, pt not making any sense at this time.

## 2017-02-24 NOTE — ED Notes (Signed)
Pt in room visiting with mother and pastoral care. Pt reports feeling a spiritual attack. Pt reports not sure if it is Jesus is talking to her or feeling anxious. Pt asked if she can go around praying for other patients. Pt encouraged to rest to calm self down. Pt reports feeling safe on the unit. Pt denies SI/HI/ and pain.

## 2017-02-24 NOTE — ED Notes (Signed)
Pt admitted to room #34. Pt reports "anxiety attack" brought her to the hospital. Pt denies SI/HI. Appears to be responding to internal stimuli. Pt reports "Honestly, I think I have schizophrenia and I'm so scared." Pt reports having "not collected thoughts" and "Holy thoughts" Pt provided specimen cup and encouraged to provide urine. Encouragement and support provided. Special checks q 15 mins in place for safety. Video monitoring in place. Will continue to monitor.

## 2017-02-24 NOTE — ED Notes (Signed)
Pt talking with eyes closed- saying "I am Jesus Christ I have died a thousand times. I am the antichrist. I had to die so that you can live. Yes, yes I did. I had to removed temptation, the apple of my eye. We broke the code through music. Yes, I wrote all music, I made all things. Yes, I know everything you're saying." Pt also speaking in whispers then turning head as if speaking to other people.

## 2017-02-24 NOTE — ED Notes (Signed)
This nurse notified EDP that pt is on unit 

## 2017-02-24 NOTE — Progress Notes (Signed)
Chaplain greets and introduces self to pt and pt's mother who is present in room at bedside.  Pt begins calmly sharing the story of her recent episode that led to her current admission.  As pt continues to share about her experience, chaplain observes as pt sits up, begins rocking back and forth and expresses angst through her voice tones and nonverbal body language.  Pt uses the term, "spiritual attack," several times.  Chaplain asks if pt is familiar with guided meditation technique, pt expresses interest in learning.  Chaplain offers a method for pt to seek spiritual rest.  Pt agrees to continue practicing this technique with her mother following chaplain visit.  Pt and mother express appreciation for chaplain visit.  Please page chaplain for further spiritual care needs.  Erroll Luna, Chaplain 02/24/17 10:08 PM    02/24/17 1900  Clinical Encounter Type  Visited With Patient and family together  Visit Type Initial;Psychological support;Spiritual support;Behavioral Health  Referral From Nurse  Consult/Referral To Chaplain  Spiritual Encounters  Spiritual Needs Sacred text;Ritual;Emotional  Stress Factors  Patient Stress Factors Exhausted;Loss of control  Family Stress Factors Family relationships

## 2017-02-24 NOTE — ED Provider Notes (Addendum)
[facility]-EMERGENCY DEPT Provider Note   CSN: [id number] Arrival date & time: 02/24/17  1040     History   Chief Complaint Chief Complaint  Patient presents with  . Psychiatric Evaluation    HPI Catherine Townsend is a 21 y.o. female.  Level V caveat for psychosis. Patient is talking nonsensically with flight of ideas and pressured speech. She makes many references to "I am Jesus Christ who has died a thousand times. I am the antichrist.  I had to die so you  could live".  There are no family members with her. She was found on another floor of the hospital acting bizarrely.      History reviewed. No pertinent past medical history.  Patient Active Problem List   Diagnosis Date Noted  . Acute encephalopathy 11/25/2016  . Iron deficiency anemia 11/25/2016  . Hallucinations   . Anemia 11/24/2016  . Acute psychosis 11/24/2016    History reviewed. No pertinent surgical history.  OB History    No data available       Home Medications    Prior to Admission medications   Medication Sig Start Date End Date Taking? Authorizing Provider  ferrous sulfate 325 (65 FE) MG tablet Take 1 tablet (325 mg total) by mouth 2 (two) times daily with a meal. 11/26/16   Catarina Hartshorn, MD    Family History History reviewed. No pertinent family history.  Social History Social History  Substance Use Topics  . Smoking status: Never Smoker  . Smokeless tobacco: Never Used  . Alcohol use No     Allergies   Patient has no known allergies.   Review of Systems Review of Systems  Reason unable to perform ROS: psychosis.     Physical Exam Updated Vital Signs BP (!) 136/94 (BP Location: Right Arm)   Pulse (!) 121   Temp 99.7 F (37.6 C) (Oral)   Resp 18   SpO2 100%   Physical Exam  Constitutional: She is oriented to person, place, and time. She appears well-developed and well-nourished.  HENT:  Head: Normocephalic and atraumatic.  Eyes: Conjunctivae are normal.  Neck: Neck supple.    Cardiovascular: Normal rate and regular rhythm.   Pulmonary/Chest: Effort normal and breath sounds normal.  Abdominal: Soft. Bowel sounds are normal.  Musculoskeletal: Normal range of motion.  Neurological: She is alert and oriented to person, place, and time.  Skin: Skin is warm and dry.  Psychiatric:  Patient is psychotic; flight of ideas; nonsensical speech; hyperreligiosity.  Nursing note and vitals reviewed.    ED Treatments / Results  Labs (all labs ordered are listed, but only abnormal results are displayed) Labs Reviewed  CBC WITH DIFFERENTIAL/PLATELET - Abnormal; Notable for the following:       Result Value   Platelets 409 (*)    All other components within normal limits  BASIC METABOLIC PANEL - Abnormal; Notable for the following:    CO2 21 (*)    Glucose, Bld 108 (*)    Creatinine, Ser 1.01 (*)    All other components within normal limits  ETHANOL  PREGNANCY, URINE  RAPID URINE DRUG SCREEN, HOSP PERFORMED    EKG  EKG Interpretation None       Radiology No results found.  Procedures Procedures (including critical care time)  Medications Ordered in ED Medications  sterile water (preservative free) injection (not administered)  ziprasidone (GEODON) injection 20 mg (20 mg Intramuscular Given 02/24/17 1231)     Initial Impression / Assessment and Plan /  ED Course  I have reviewed the triage vital signs and the nursing notes.  Pertinent labs & imaging results that were available during my care of the patient were reviewed by me and considered in my medical decision making (see chart for details).    CRITICAL CARE Performed by: Donnetta Hutching Total critical care time: 30 minutes Critical care time was exclusive of separately billable procedures and treating other patients. Critical care was necessary to treat or prevent imminent or life-threatening deterioration. Critical care was time spent personally by me on the following activities: development of  treatment plan with patient and/or surrogate as well as nursing, discussions with consultants, evaluation of patient's response to treatment, examination of patient, obtaining history from patient or surrogate, ordering and performing treatments and interventions, ordering and review of laboratory studies, ordering and review of radiographic studies, pulse oximetry and re-evaluation of patient's condition.  Patient is psychotic I initiated a judicial commitment. Geodon 20 mg IM administered. Patient fled from the ED and was recaptured. Psych consult obtained. Will ultimately transfer to [facility].   Final Clinical Impressions(s) / ED Diagnoses   Final diagnoses:  Psychosis, unspecified psychosis type    New Prescriptions New Prescriptions   No medications on file     Donnetta Hutching, MD 02/24/17 1553    Donnetta Hutching, MD 02/24/17 1554

## 2017-02-24 NOTE — ED Notes (Signed)
Pt woke up stating Jesus is talking to her not to lie or walk out of her room. Pt reported she is afraid because she does not want to disobey Jesus. Pt was help to bathroom. Pt stayed in for quite a significant amount of time but was not able to produce any urine sample. Pt asked for medication for sleep. Pt is up praying and talking to self.

## 2017-02-24 NOTE — ED Notes (Signed)
Pt. No answer x 2

## 2017-02-24 NOTE — ED Triage Notes (Signed)
Pt reports being "Catherine Townsend" she states she has no medical history and is here to preach. Staff was unable to locate pt earlier for triage and pt was later found upstairs in pt's rooms. She appears manic and flight of ideas but denies psych hx.

## 2017-02-24 NOTE — ED Notes (Signed)
Pt mother at bedside visiting with pt.  

## 2017-02-24 NOTE — ED Notes (Signed)
Pt has had three episodes of getting out of bed and attempting to run.  She just removed her bottoms and was attempting to remove her top when the sitter, Terri, and I intervened.  Pt was stating "I have to accept who I am."

## 2017-02-24 NOTE — Progress Notes (Signed)
Patient has been referred to the following inpatient treatment facilities: [place], [address], [place], [place], [place].  CSW in disposition will continue to follow up with placement efforts.  Melbourne Abts, LCSWA Disposition staff 02/24/2017 2:18 PM

## 2017-02-24 NOTE — ED Notes (Signed)
Contacted CN at [facility] for placement due to pt likeliness to continue attempting to run.

## 2017-02-24 NOTE — ED Notes (Signed)
Pt took off running down the hallway this tech and Suszanne Conners were able to get her back to her room.

## 2017-02-24 NOTE — ED Notes (Signed)
PT placed in triage room.

## 2017-02-24 NOTE — ED Notes (Signed)
[facility] called for transport 

## 2017-02-24 NOTE — ED Notes (Signed)
Pt observed sleeping does not appear to be in any distress.

## 2017-02-24 NOTE — ED Notes (Signed)
Pt continues to speak nonsensically but is being cooperative with nursing staff.

## 2017-02-25 DIAGNOSIS — F23 Brief psychotic disorder: Secondary | ICD-10-CM

## 2017-02-25 LAB — RAPID URINE DRUG SCREEN, HOSP PERFORMED
AMPHETAMINES: NOT DETECTED
BENZODIAZEPINES: NOT DETECTED
Barbiturates: NOT DETECTED
COCAINE: NOT DETECTED
Opiates: NOT DETECTED
Tetrahydrocannabinol: NOT DETECTED

## 2017-02-25 LAB — PREGNANCY, URINE: Preg Test, Ur: NEGATIVE

## 2017-02-25 MED ORDER — LORAZEPAM 1 MG PO TABS: 1.0000 mg | ORAL_TABLET | Freq: Once | ORAL | Status: DC

## 2017-02-25 NOTE — [facility] Suicide Risk Assessment (Signed)
Suicide Risk Assessment  Discharge Assessment   [facility] Discharge Suicide Risk Assessment   Principal Problem: Acute psychosis Discharge Diagnoses:  Patient Active Problem List   Diagnosis Date Noted  . Acute psychosis [F23] 11/24/2016    Priority: High  . Acute encephalopathy [G93.40] 11/25/2016  . Iron deficiency anemia [D50.9] 11/25/2016  . Hallucinations [R44.3]   . Anemia [D64.9] 11/24/2016    Total Time spent with patient: 45 minutes  Musculoskeletal: Strength & Muscle Tone: within normal limits Gait & Station: normal Patient leans: N/A  Psychiatric Specialty Exam: Physical Exam  Constitutional: She is oriented to person, place, and time. She appears well-developed and well-nourished.  HENT:  Head: Normocephalic.  Neck: Normal range of motion.  Respiratory: Effort normal.  Musculoskeletal: Normal range of motion.  Neurological: She is alert and oriented to person, place, and time.  Psychiatric: Judgment normal. Her mood appears anxious. Her affect is labile. Her speech is rapid and/or pressured and tangential. Thought content is delusional. Cognition and memory are normal. She is inattentive.    Review of Systems  Psychiatric/Behavioral: The patient is nervous/anxious and has insomnia.   All other systems reviewed and are negative.   Blood pressure (!) 145/83, pulse 100, temperature 98.3 F (36.8 C), resp. rate 16, SpO2 99 %.There is no height or weight on file to calculate BMI.  General Appearance: Casual  Eye Contact:  Fair  Speech:  Pressured, slight  Volume:  Normal  Mood:  Anxious  Affect:  Flat  Thought Process:  Coherent and Descriptions of Associations: Tangential  Orientation:  Full (Time, Place, and Person)  Thought Content:  Delusions and Tangential  Suicidal Thoughts:  No  Homicidal Thoughts:  No  Memory:  Immediate;   Fair Recent;   Fair Remote;   Fair  Judgement:  Fair  Insight:  Fair  Psychomotor Activity:  Normal  Concentration:   Concentration: Fair and Attention Span: Fair  Recall:  [employer] of Knowledge:  Good  Language:  Good  Akathisia:  No  Handed:  Right  AIMS (if indicated):     Assets:  Communication Skills Desire for Improvement Financial Resources/Insurance Housing Leisure Time Physical Health Resilience Social Support Talents/Skills Transportation Vocational/Educational  ADL's:  Intact  Cognition:  WNL  Sleep:       Mental Status Per Nursing Assessment::   On Admission:   mania/psychosis  Demographic Factors:  Adolescent or young adult  Loss Factors: NA  Historical Factors: NA  Risk Reduction Factors:   Sense of responsibility to family and Positive social support  Continued Clinical Symptoms:  Hypomania  Cognitive Features That Contribute To Risk:  None    Suicide Risk:  Minimal: No identifiable suicidal ideation.  Patients presenting with no risk factors but with morbid ruminations; may be classified as minimal risk based on the severity of the depressive symptoms    Plan Of Care/Follow-up recommendations:  Activity:  as tolerated Diet:  heart healthy diet  Delila Kuklinski, NP 02/25/2017, 1:43 PM

## 2017-02-25 NOTE — ED Notes (Signed)
Pt pleasant after she woke up. EKG and vital taken. Pt asked to shower and toiletries items provided. Pt came out screaming stating she can not figure out how to turn shower on. Pt asked if she wanted writer to help her or use the other bathroom. Pt declined and walked to her room saying she is not in a good state to learn to use a shower. Pt reports her mother works for [employer] and need to be informed the color blue is for the devil.

## 2017-02-25 NOTE — Consult Note (Signed)
[facility] Psychiatry Consult   Reason for Consult:  Psychosis  Referring Physician:  EDP Patient Identification: Catherine Townsend MRN:  [id number] Principal Diagnosis: Acute psychosis Diagnosis:   Patient Active Problem List   Diagnosis Date Noted  . Acute psychosis [F23] 11/24/2016    Priority: High  . Acute encephalopathy [G93.40] 11/25/2016  . Iron deficiency anemia [D50.9] 11/25/2016  . Hallucinations [R44.3]   . Anemia [D64.9] 11/24/2016    Total Time spent with patient: 45 minutes  Subjective:   Catherine Townsend is a 21 y.o. female patient admission recommended.  HPI:  21 yo female who presented to the ED with delusions of being Jesus with mania and flight of ideas.  She was given agitation medication and today is calm but has slightly pressured speech with flight of ideas at times.  Reports she had "a spiritual attack" yesterday but better since she broke through the black window.  She had a similar episode four months ago and was admitted to the medical hospital, resolved.  Her aunt/mother is at her bedside and is supportive.  Agreeable to inpatient treatment but wants to take her to [facility] as she lives in [place].  Her husband/dad is coming to drive them there.  Impulsivity and dangers discussed with the family and safety precautions.  Agreeable with taking responsibility for the patient.  Patient is calm and agreeable, listening to her aunt.  Past Psychiatric History: psychosis  Risk to Self: Suicidal Ideation: No Suicidal Intent: No Is patient at risk for suicide?: No Suicidal Plan?: No Access to Means: No What has been your use of drugs/alcohol within the last 12 months?: Marijuana (UDS/BAC not available at time of assessment) Intentional Self Injurious Behavior: None Risk to Others: Homicidal Ideation: No Thoughts of Harm to Others: No Current Homicidal Intent: No Current Homicidal Plan: No Access to Homicidal Means: No History of harm to others?: No Assessment of  Violence: None Noted Does patient have access to weapons?: No Criminal Charges Pending?: No Does patient have a court date: No Prior Inpatient Therapy: Prior Inpatient Therapy: No (Unknown) Prior Outpatient Therapy: Prior Outpatient Therapy: No (Unknown) Does patient have an ACCT team?: Unknown Does patient have Intensive In-House Services?  : Unknown Does patient have [facility] services? : Unknown Does patient have [facility] services?: Unknown  Past Medical History: History reviewed. No pertinent past medical history. History reviewed. No pertinent surgical history. Family History: History reviewed. No pertinent family history. Family Psychiatric  History: none on maternal side but paternal side unknown Social History:  History  Alcohol Use No     History  Drug Use  . Types: Marijuana    Comment: Pt endorsed, but did not provide specifics    Social History   Social History  . Marital status: Single    Spouse name: N/A  . Number of children: N/A  . Years of education: N/A   Social History Main Topics  . Smoking status: Never Smoker  . Smokeless tobacco: Never Used  . Alcohol use No  . Drug use: Yes    Types: Marijuana     Comment: Pt endorsed, but did not provide specifics  . Sexual activity: Not Currently   Other Topics Concern  . None   Social History Narrative  . None   Additional Social History:    Allergies:  No Known Allergies  Labs:  Results for orders placed or performed during the hospital encounter of 02/24/17 (from the past 48 hour(s))  CBC with Differential  Status: Abnormal   Collection Time: 02/24/17 12:01 PM  Result Value Ref Range   WBC 6.8 4.0 - 10.5 K/uL   RBC 4.96 3.87 - 5.11 MIL/uL   Hemoglobin 14.0 12.0 - 15.0 g/dL   HCT 41.7 36.0 - 46.0 %   MCV 84.1 78.0 - 100.0 fL   MCH 28.2 26.0 - 34.0 pg   MCHC 33.6 30.0 - 36.0 g/dL   RDW 15.0 11.5 - 15.5 %   Platelets 409 (H) 150 - 400 K/uL   Neutrophils Relative % 72 %   Neutro Abs 4.8 1.7 - 7.7  K/uL   Lymphocytes Relative 22 %   Lymphs Abs 1.5 0.7 - 4.0 K/uL   Monocytes Relative 6 %   Monocytes Absolute 0.4 0.1 - 1.0 K/uL   Eosinophils Relative 0 %   Eosinophils Absolute 0.0 0.0 - 0.7 K/uL   Basophils Relative 0 %   Basophils Absolute 0.0 0.0 - 0.1 K/uL  Basic metabolic panel     Status: Abnormal   Collection Time: 02/24/17 12:01 PM  Result Value Ref Range   Sodium 135 135 - 145 mmol/L   Potassium 3.5 3.5 - 5.1 mmol/L   Chloride 102 101 - 111 mmol/L   CO2 21 (L) 22 - 32 mmol/L   Glucose, Bld 108 (H) 65 - 99 mg/dL   BUN 6 6 - 20 mg/dL   Creatinine, Ser 1.01 (H) 0.44 - 1.00 mg/dL   Calcium 9.4 8.9 - 10.3 mg/dL   GFR calc non Af Amer >60 >60 mL/min   GFR calc Af Amer >60 >60 mL/min    Comment: (NOTE) The eGFR has been calculated using the CKD EPI equation. This calculation has not been validated in all clinical situations. eGFR's persistently <60 mL/min signify possible Chronic Kidney Disease.    Anion gap 12 5 - 15  Ethanol     Status: None   Collection Time: 02/24/17 12:01 PM  Result Value Ref Range   Alcohol, Ethyl (B) <5 <5 mg/dL    Comment:        LOWEST DETECTABLE LIMIT FOR SERUM ALCOHOL IS 5 mg/dL FOR MEDICAL PURPOSES ONLY   Rapid urine drug screen (hospital performed)     Status: None   Collection Time: 02/24/17 10:26 PM  Result Value Ref Range   Opiates NONE DETECTED NONE DETECTED   Cocaine NONE DETECTED NONE DETECTED   Benzodiazepines NONE DETECTED NONE DETECTED   Amphetamines NONE DETECTED NONE DETECTED   Tetrahydrocannabinol NONE DETECTED NONE DETECTED   Barbiturates NONE DETECTED NONE DETECTED    Comment:        DRUG SCREEN FOR MEDICAL PURPOSES ONLY.  IF CONFIRMATION IS NEEDED FOR ANY PURPOSE, NOTIFY LAB WITHIN 5 DAYS.        LOWEST DETECTABLE LIMITS FOR URINE DRUG SCREEN Drug Class       Cutoff (ng/mL) Amphetamine      1000 Barbiturate      200 Benzodiazepine   161 Tricyclics       096 Opiates          300 Cocaine          300 THC               50   Pregnancy, urine     Status: None   Collection Time: 02/24/17 10:26 PM  Result Value Ref Range   Preg Test, Ur NEGATIVE NEGATIVE    Comment:        THE SENSITIVITY OF THIS METHODOLOGY  IS >20 mIU/mL.     Current Facility-Administered Medications  Medication Dose Route Frequency Provider Last Rate Last Dose  . hydrOXYzine (ATARAX/VISTARIL) tablet 25 mg  25 mg Oral Q6H PRN Patrecia Pour, NP   25 mg at 02/25/17 0028  . LORazepam (ATIVAN) tablet 1 mg  1 mg Oral Once Patrecia Pour, NP       Current Outpatient Prescriptions  Medication Sig Dispense Refill  . ferrous sulfate 325 (65 FE) MG tablet Take 1 tablet (325 mg total) by mouth 2 (two) times daily with a meal. 60 tablet 3    Musculoskeletal: Strength & Muscle Tone: within normal limits Gait & Station: normal Patient leans: N/A  Psychiatric Specialty Exam: Physical Exam  Constitutional: She is oriented to person, place, and time. She appears well-developed and well-nourished.  HENT:  Head: Normocephalic.  Neck: Normal range of motion.  Respiratory: Effort normal.  Musculoskeletal: Normal range of motion.  Neurological: She is alert and oriented to person, place, and time.  Psychiatric: Judgment normal. Her mood appears anxious. Her affect is labile. Her speech is rapid and/or pressured and tangential. Thought content is delusional. Cognition and memory are normal. She is inattentive.    Review of Systems  Psychiatric/Behavioral: The patient is nervous/anxious and has insomnia.   All other systems reviewed and are negative.   Blood pressure (!) 145/83, pulse 100, temperature 98.3 F (36.8 C), resp. rate 16, SpO2 99 %.There is no height or weight on file to calculate BMI.  General Appearance: Casual  Eye Contact:  Fair  Speech:  Pressured, slight  Volume:  Normal  Mood:  Anxious  Affect:  Flat  Thought Process:  Coherent and Descriptions of Associations: Tangential  Orientation:  Full (Time, Place, and  Person)  Thought Content:  Delusions and Tangential  Suicidal Thoughts:  No  Homicidal Thoughts:  No  Memory:  Immediate;   Fair Recent;   Fair Remote;   Fair  Judgement:  Fair  Insight:  Fair  Psychomotor Activity:  Normal  Concentration:  Concentration: Fair and Attention Span: Fair  Recall:  [employer] of Knowledge:  Good  Language:  Good  Akathisia:  No  Handed:  Right  AIMS (if indicated):     Assets:  Communication Skills Desire for Improvement Financial Resources/Insurance Housing Leisure Time Physical Health Resilience Social Support Talents/Skills Transportation Vocational/Educational  ADL's:  Intact  Cognition:  WNL  Sleep:        Treatment Plan Summary: Daily contact with patient to assess and evaluate symptoms and progress in treatment, Medication management and Plan psychosis:  -Crisis stabilization -Medication management:  Geodon 20 mg IM once for agitation along with Vistaril 25 mg every six hours PRN anxiety, Ativan 1 mg once for anxiety -Individual counseling  Disposition: Recommend psychiatric Inpatient admission when medically cleared.  Waylan Boga, NP 02/25/2017 10:40 AM  Patient seen face-to-face for psychiatric evaluation, chart reviewed and case discussed with the physician extender and developed treatment plan. Reviewed the information documented and agree with the treatment plan. Corena Pilgrim, MD

## 2017-02-25 NOTE — [facility] Assessment (Signed)
[facility] Assessment Progress Note  Per Thedore Mins, MD, this pt does not require psychiatric hospitalization at this time.  Pt presents under IVC initiated by EDP Donnetta Hutching, MD, with Dr Jannifer Franklin has rescinded.  Pt is to be discharged from [facility] to the care of her accompanying family.  Pt's nurse, Dawnaly, has been notified.  Doylene Canning, MA Triage Specialist [phone]

## 2017-12-11 IMAGING — DX DG CHEST 1V PORT
1 series · 1 of 1 positions shown · non-contrast
Comparison: None.

CLINICAL DATA: Altered mental status, tachycardia

EXAM:
PORTABLE CHEST 1 VIEW

[chest ap]
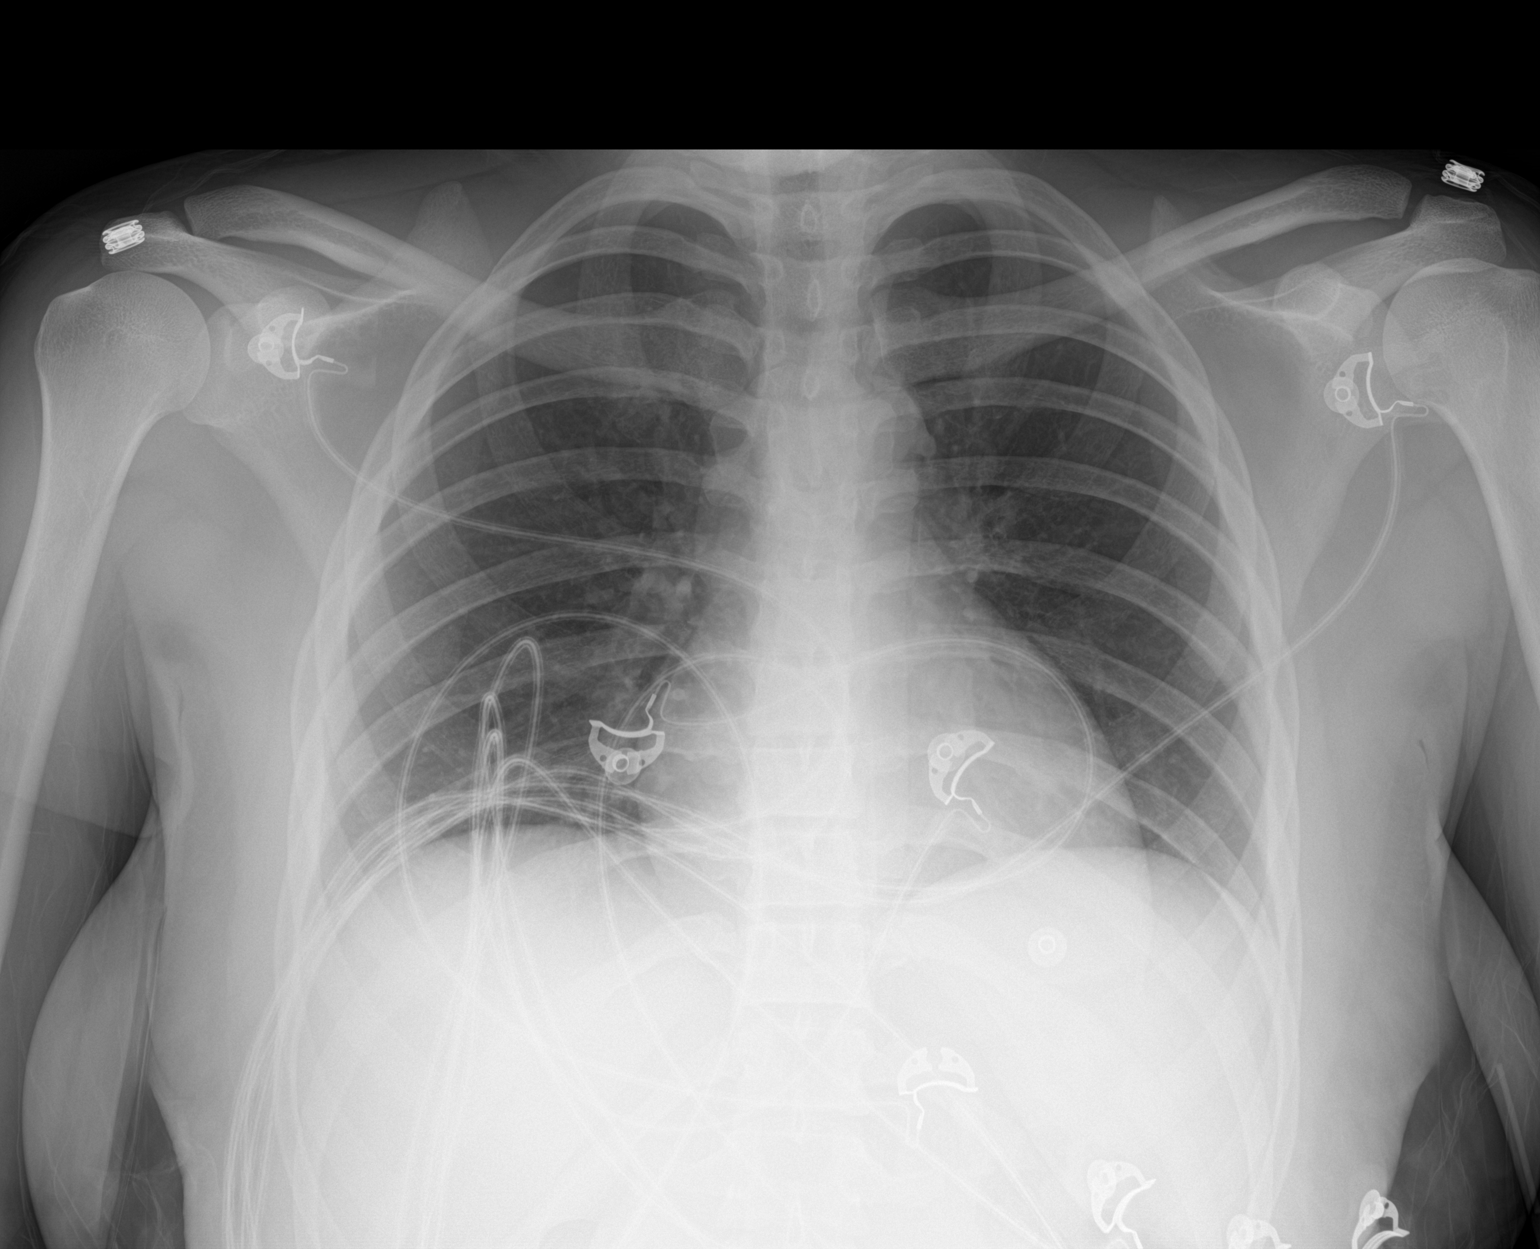

[1 of 1 positions shown; findings below may reference images not displayed]

FINDINGS: The heart size and mediastinal contours are within normal limits.
Both lungs are clear. The visualized skeletal structures are
unremarkable.
IMPRESSION: No active disease.

## 2021-10-20 ENCOUNTER — Emergency Department (HOSPITAL_COMMUNITY)
Admission: EM | Admit: 2021-10-20 | Discharge: 2021-10-20 | Discharge status: Absent without leave | Payer: BC Managed Care – PPO | Source: Home / Self Care
